# Patient Record
Sex: Male | Born: 1965 | Race: Black or African American | Hispanic: No
Health system: Southern US, Community
[De-identification: ages and names within clinical notes are randomized; demographics above are authoritative.]

---

## 2010-08-19 ENCOUNTER — Emergency Department: Payer: Self-pay | Admitting: Unknown Physician Specialty

## 2010-08-29 ENCOUNTER — Emergency Department: Payer: Self-pay | Admitting: Unknown Physician Specialty

## 2019-12-24 ENCOUNTER — Other Ambulatory Visit: Payer: Self-pay

## 2019-12-24 ENCOUNTER — Emergency Department
Admission: EM | Admit: 2019-12-24 | Discharge: 2019-12-24 | Disposition: A | Discharge status: Home / Self Care | Payer: Medicaid Other | Attending: Emergency Medicine | Admitting: Emergency Medicine

## 2019-12-24 ENCOUNTER — Emergency Department: Payer: Medicaid Other

## 2019-12-24 DIAGNOSIS — Z79899 Other long term (current) drug therapy: Secondary | ICD-10-CM | POA: Diagnosis not present

## 2019-12-24 DIAGNOSIS — Z7982 Long term (current) use of aspirin: Secondary | ICD-10-CM | POA: Insufficient documentation

## 2019-12-24 DIAGNOSIS — J029 Acute pharyngitis, unspecified: Secondary | ICD-10-CM | POA: Diagnosis not present

## 2019-12-24 DIAGNOSIS — Z20822 Contact with and (suspected) exposure to covid-19: Secondary | ICD-10-CM | POA: Diagnosis not present

## 2019-12-24 DIAGNOSIS — J189 Pneumonia, unspecified organism: Secondary | ICD-10-CM | POA: Insufficient documentation

## 2019-12-24 DIAGNOSIS — R05 Cough: Secondary | ICD-10-CM | POA: Diagnosis present

## 2019-12-24 LAB — GROUP A STREP BY PCR: Group A Strep by PCR: NOT DETECTED

## 2019-12-24 LAB — SARS CORONAVIRUS 2 BY RT PCR (HOSPITAL ORDER, PERFORMED IN ~~LOC~~ HOSPITAL LAB): SARS Coronavirus 2: NEGATIVE

## 2019-12-24 MED ORDER — METHYLPREDNISOLONE 4 MG PO TBPK: ORAL_TABLET | ORAL | 0 refills | Status: AC

## 2019-12-24 MED ORDER — AZITHROMYCIN 250 MG PO TABS: ORAL_TABLET | ORAL | 0 refills | Status: AC

## 2019-12-24 MED ORDER — METHYLPREDNISOLONE SODIUM SUCC 125 MG IJ SOLR
125.0000 mg | Freq: Once | INTRAMUSCULAR | Status: AC
  Administered 2019-12-24: 125 mg via INTRAMUSCULAR
  Filled 2019-12-24: qty 2

## 2019-12-24 MED ORDER — PSEUDOEPH-BROMPHEN-DM 30-2-10 MG/5ML PO SYRP
5.0000 mL | ORAL_SOLUTION | Freq: Four times a day (QID) | ORAL | 0 refills | Status: AC | PRN
Start: 2019-12-24 — End: ?

## 2019-12-24 MED ORDER — LIDOCAINE VISCOUS HCL 2 % MT SOLN: 5.0000 mL | Freq: Four times a day (QID) | OROMUCOSAL | 0 refills | Status: AC | PRN

## 2019-12-24 NOTE — ED Provider Notes (Signed)
[facility] Emergency Department Provider Note   ____________________________________________   First MD Initiated Contact with Patient 12/24/19 1304     (approximate)  I have reviewed the triage vital signs and the nursing notes.   HISTORY  Chief Complaint Cough and Sore Throat    HPI Richard Cole is a 54 y.o. male patient complain of cough, sore throat, and fatigue.  Patient denies fever/chills.  Patient denies nausea, vomiting, diarrhea.  Patient denies recent travel or known contact with COVID-19.  Patient that he was tested 2 weeks ago and was negative for COVID-19.  Patient has not received vaccines.  Patient had a heart catheterization on 11/12/2019.      History reviewed. No pertinent past medical history.  There are no problems to display for this patient.   History reviewed. No pertinent surgical history.  Prior to Admission medications   Medication Sig Start Date End Date Taking? Authorizing Provider  aspirin EC 81 MG tablet Take 81 mg by mouth daily.   Yes [provider]  atorvastatin (LIPITOR) 40 MG tablet Take 40 mg by mouth daily.   Yes [provider]  carvedilol (COREG) 25 MG tablet Take 25 mg by mouth 2 (two) times daily with a meal.   Yes [provider]  digoxin (LANOXIN) 0.125 MG tablet Take 0.125 mg by mouth daily.   Yes [provider]  fluticasone (FLONASE) 50 MCG/ACT nasal spray Place 1 spray into both nostrils daily.   Yes [provider]  furosemide (LASIX) 40 MG tablet Take 40 mg by mouth.   Yes [provider]  isosorbide mononitrate (IMDUR) 30 MG 24 hr tablet Take 30 mg by mouth daily.   Yes [provider]  lisinopril (ZESTRIL) 10 MG tablet Take 10 mg by mouth daily.   Yes [provider]  Sacubitril-Valsartan (ENTRESTO PO) Take by mouth.   Yes [provider]  azithromycin (ZITHROMAX Z-PAK) 250 MG tablet Take 2 tablets (500 mg) on  Day 1,   followed by 1 tablet (250 mg) once daily on Days 2 through 5. 12/24/19 12/29/19  Joni Reining, PA-C  brompheniramine-pseudoephedrine-DM 30-2-10 MG/5ML syrup Take 5 mLs by mouth 4 (four) times daily as needed. Mix with 5 mL of viscous lidocaine for swish and swallow. 12/24/19   Joni Reining, PA-C  lidocaine (XYLOCAINE) 2 % solution Use as directed 5 mLs in the mouth or throat every 6 (six) hours as needed for mouth pain. Mix with 5 mL of Bromfed-DM for swish and swallow. 12/24/19   Joni Reining, PA-C  methylPREDNISolone (MEDROL DOSEPAK) 4 MG TBPK tablet Take Tapered dose as directed 12/24/19   Joni Reining, PA-C    Allergies Patient has no allergy information on record.  No family history on file.  Social History Social History   Tobacco Use  . Smoking status: Not on file  Substance Use Topics  . Alcohol use: Not on file  . Drug use: Not on file    Review of Systems Constitutional: No fever/chills Eyes: No visual changes. ENT: Sore throat. Cardiovascular: Denies chest pain. Respiratory: Denies shortness of breath.  Productive cough Gastrointestinal: No abdominal pain.  No nausea, no vomiting.  No diarrhea.  No constipation. Genitourinary: Negative for dysuria. Musculoskeletal: Negative for back pain. Skin: Negative for rash. Neurological: Negative for headaches, focal weakness or numbness.   ____________________________________________   PHYSICAL EXAM:  VITAL SIGNS: ED Triage Vitals  Enc Vitals Group     BP  12/24/19 1252 (!) 129/46     Pulse Rate 12/24/19 1252 67     Resp 12/24/19 1252 18     Temp 12/24/19 1252 98.6 F (37 C)     Temp Source 12/24/19 1252 Oral     SpO2 12/24/19 1252 94 %     Weight 12/24/19 1247 250 lb (113.4 kg)     Height 12/24/19 1247 6\' 2"  (1.88 m)     Head Circumference --      Peak Flow --      Pain Score 12/24/19 1247 4     Pain Loc --      Pain Edu? --      Excl. in GC? --    Constitutional: Alert and oriented. Well appearing  and in no acute distress. Eyes: Conjunctivae are normal. PERRL. EOMI. Head: Atraumatic. Nose: Edematous nasal turbinates clear rhinorrhea Mouth/Throat: Mucous membranes are moist.  Oropharynx erythematous.  No visible exudate. Neck: No stridor.   Hematological/Lymphatic/Immunilogical: No cervical lymphadenopathy. Cardiovascular: Normal rate, regular rhythm. Grossly normal heart sounds.  Good peripheral circulation. Respiratory: Normal respiratory effort.  No retractions. Lungs CTAB. Skin:  Skin is warm, dry and intact. No rash noted. Psychiatric: Mood and affect are normal. Speech and behavior are normal.  ____________________________________________   LABS (all labs ordered are listed, but only abnormal results are displayed)  Labs Reviewed  GROUP A STREP BY PCR  SARS CORONAVIRUS 2 BY RT PCR (HOSPITAL ORDER, PERFORMED IN [facility])   ____________________________________________  EKG   ____________________________________________  RADIOLOGY  ED MD interpretation:    Official radiology report(s): DG Chest Portable 1 View  Result Date: 12/24/2019 CLINICAL DATA:  Productive cough for the past 3 days with shortness of breath. EXAM: PORTABLE CHEST 1 VIEW COMPARISON:  None. FINDINGS: The heart size and mediastinal contours are within normal limits. Normal pulmonary vascularity. Increased reticulonodular opacities at the right lung base. No pleural effusion or pneumothorax. No acute osseous abnormality. IMPRESSION: 1. Increased reticulonodular opacities at the right lung base concerning for pneumonia. Electronically Signed   By: 02/23/2020 M.D.   On: 12/24/2019 13:50    ____________________________________________   PROCEDURES  Procedure(s) performed (including Critical Care):  Procedures   ____________________________________________   INITIAL IMPRESSION / ASSESSMENT AND PLAN / ED COURSE  As part of my medical decision making, I reviewed the  following data within the electronic MEDICAL RECORD NUMBER     Patient presents with 2 days of productive cough, sore throat, chest congestion.  Discussed x-rays findings of the chest consistent with right lower lobe pneumonia.  Patient given discharge care instruction advised take medication as directed.  Patient advised to self quarantine pending results of COVID-19 test.  If test is positive was quarantine additional 10 days.  Strep test was negative.     Richard Cole was evaluated in Emergency Department on 12/24/2019 for the symptoms described in the history of present illness. He was evaluated in the context of the global COVID-19 pandemic, which necessitated consideration that the patient might be at risk for infection with the SARS-CoV-2 virus that causes COVID-19. Institutional protocols and algorithms that pertain to the evaluation of patients at risk for COVID-19 are in a state of rapid change based on information released by regulatory bodies including the CDC and federal and state organizations. These policies and algorithms were followed during the patient's care in the ED.       ____________________________________________   FINAL CLINICAL IMPRESSION(S) / ED DIAGNOSES  Final diagnoses:  Community acquired pneumonia of right lower lobe of lung  Sore throat     ED Discharge Orders         Ordered    azithromycin (ZITHROMAX Z-PAK) 250 MG tablet     Discontinue  Reprint     12/24/19 1408    brompheniramine-pseudoephedrine-DM 30-2-10 MG/5ML syrup  4 times daily PRN     Discontinue  Reprint     12/24/19 1408    lidocaine (XYLOCAINE) 2 % solution  Every 6 hours PRN     Discontinue  Reprint     12/24/19 1408    methylPREDNISolone (MEDROL DOSEPAK) 4 MG TBPK tablet     Discontinue  Reprint     12/24/19 1408           Note:  This document was prepared using Dragon voice recognition software and may include unintentional dictation errors.    Sable Feil, PA-C 12/24/19  1412    Nena Polio, MD 12/24/19 [phone]

## 2019-12-24 NOTE — ED Triage Notes (Signed)
Pt comes via POV from home with c/o cough and sore throat for about 2 days. Pt states productive cough. Pt denies any fevers

## 2019-12-24 NOTE — Discharge Instructions (Signed)
Follow discharge care instruction take medication as directed.  Advised self quarantine pending results of COVID-19 test.  If test is positive must quarantine additional 10 days.

## 2020-10-18 IMAGING — DX DG CHEST 1V PORT
1 series · 1 of 1 positions shown · non-contrast
Comparison: None.

CLINICAL DATA: Productive cough for the past 3 days with shortness
of breath.

EXAM:
PORTABLE CHEST 1 VIEW

[chest ap]
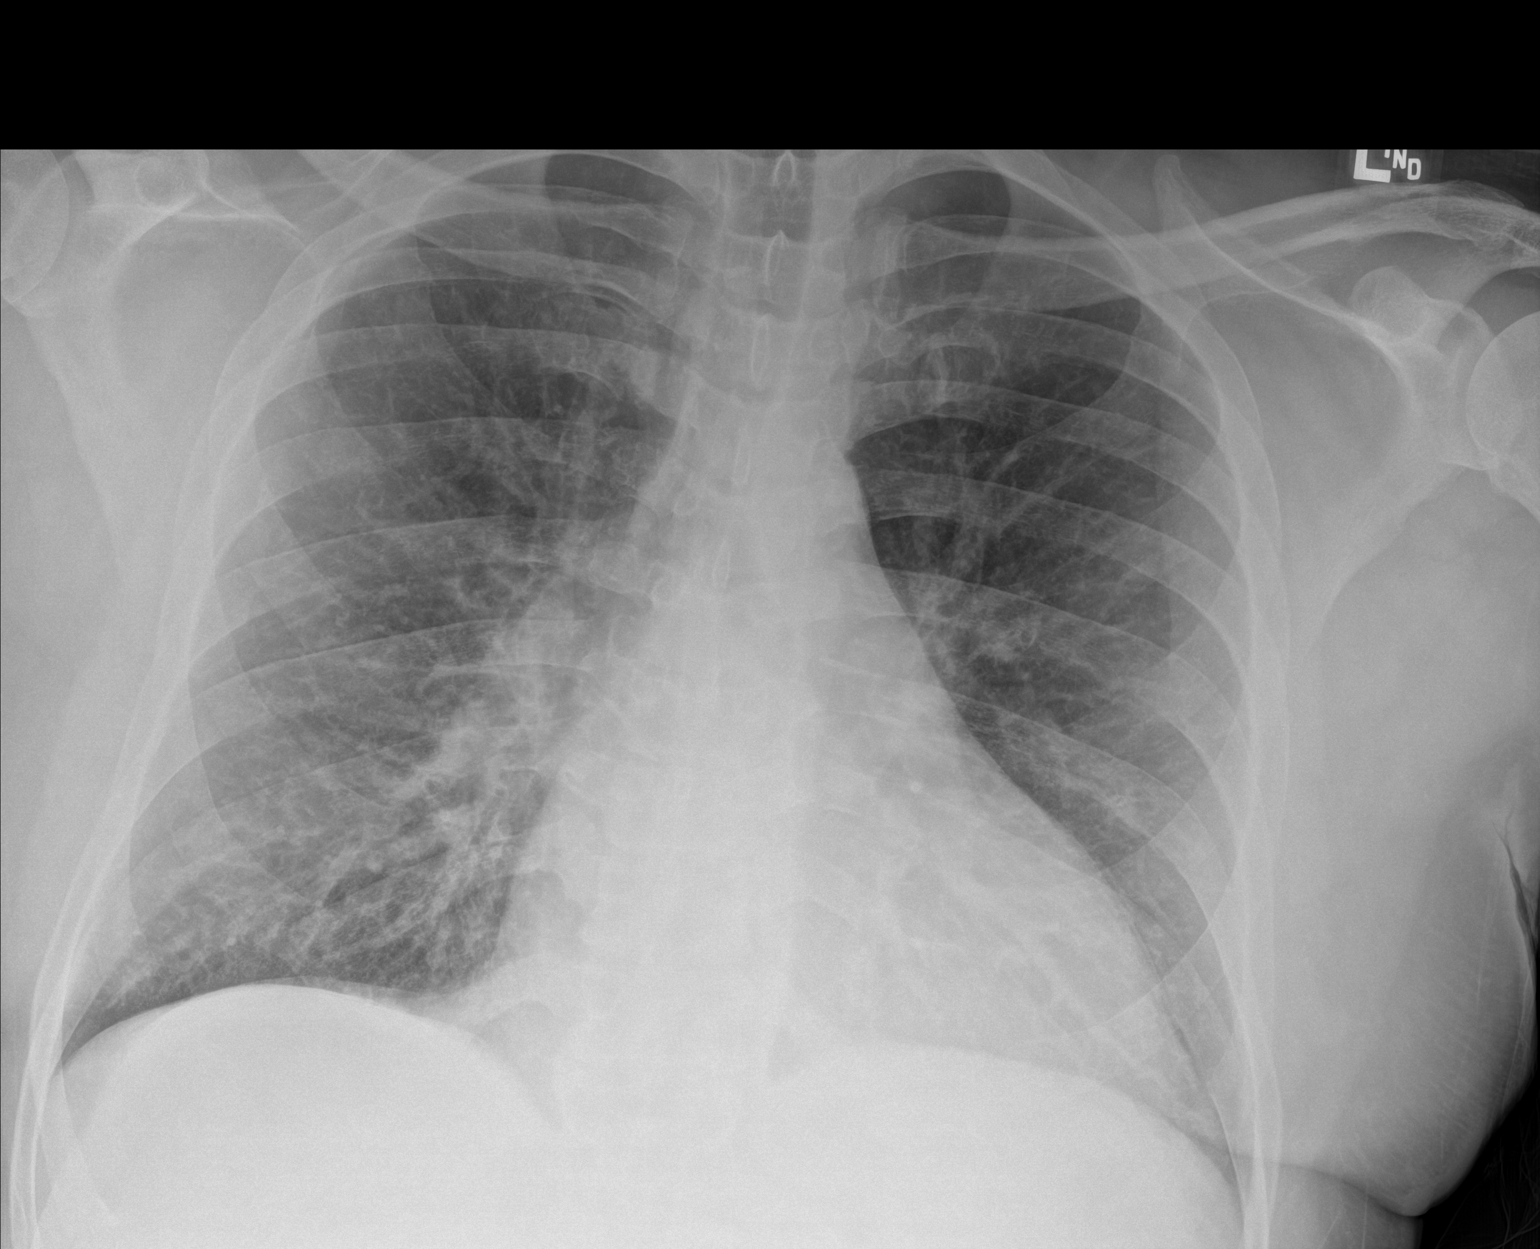

[1 of 1 positions shown; findings below may reference images not displayed]

FINDINGS: The heart size and mediastinal contours are within normal limits.
Normal pulmonary vascularity. Increased reticulonodular opacities at
the right lung base. No pleural effusion or pneumothorax. No acute
osseous abnormality.
IMPRESSION: 1. Increased reticulonodular opacities at the right lung base
concerning for pneumonia.

## 2022-10-15 DEATH — deceased
